# Patient Record
Sex: Female | Born: 1940 | Race: White | Hispanic: No | Marital: Single | Smoking: Former smoker
Health system: Southern US, Community
[De-identification: ages and names within clinical notes are randomized; demographics above are authoritative.]

## PROBLEM LIST (undated history)

## (undated) DIAGNOSIS — M1990 Unspecified osteoarthritis, unspecified site: Secondary | ICD-10-CM

## (undated) DIAGNOSIS — I10 Essential (primary) hypertension: Secondary | ICD-10-CM

## (undated) HISTORY — PX: CATARACT EXTRACTION: SUR2

---

## 2011-01-30 ENCOUNTER — Ambulatory Visit (INDEPENDENT_AMBULATORY_CARE_PROVIDER_SITE_OTHER): Payer: Medicare Other

## 2011-01-30 ENCOUNTER — Inpatient Hospital Stay (INDEPENDENT_AMBULATORY_CARE_PROVIDER_SITE_OTHER)
Admission: RE | Admit: 2011-01-30 | Discharge: 2011-01-30 | Disposition: A | Discharge status: Home / Self Care | Payer: Medicare Other | Source: Ambulatory Visit | Attending: Emergency Medicine | Admitting: Emergency Medicine

## 2011-01-30 DIAGNOSIS — IMO0002 Reserved for concepts with insufficient information to code with codable children: Secondary | ICD-10-CM

## 2013-12-09 ENCOUNTER — Encounter (HOSPITAL_COMMUNITY): Payer: Medicare Other | Admitting: Anesthesiology

## 2013-12-09 ENCOUNTER — Emergency Department (HOSPITAL_COMMUNITY): Payer: Medicare Other

## 2013-12-09 ENCOUNTER — Observation Stay (HOSPITAL_COMMUNITY)
Admission: EM | Admit: 2013-12-09 | Discharge: 2013-12-10 | Disposition: A | Discharge status: Home / Self Care | Payer: Medicare Other | Attending: Orthopaedic Surgery | Admitting: Orthopaedic Surgery

## 2013-12-09 ENCOUNTER — Encounter (HOSPITAL_COMMUNITY)
Admission: EM | Disposition: A | Discharge status: Home / Self Care | Payer: Self-pay | Source: Home / Self Care | Attending: Emergency Medicine

## 2013-12-09 ENCOUNTER — Encounter (HOSPITAL_COMMUNITY): Payer: Self-pay | Admitting: Emergency Medicine

## 2013-12-09 ENCOUNTER — Emergency Department (HOSPITAL_COMMUNITY): Payer: Medicare Other | Admitting: Anesthesiology

## 2013-12-09 DIAGNOSIS — W19XXXA Unspecified fall, initial encounter: Secondary | ICD-10-CM

## 2013-12-09 DIAGNOSIS — K449 Diaphragmatic hernia without obstruction or gangrene: Secondary | ICD-10-CM | POA: Insufficient documentation

## 2013-12-09 DIAGNOSIS — S52609A Unspecified fracture of lower end of unspecified ulna, initial encounter for closed fracture: Principal | ICD-10-CM

## 2013-12-09 DIAGNOSIS — Y92009 Unspecified place in unspecified non-institutional (private) residence as the place of occurrence of the external cause: Secondary | ICD-10-CM | POA: Insufficient documentation

## 2013-12-09 DIAGNOSIS — S5291XA Unspecified fracture of right forearm, initial encounter for closed fracture: Secondary | ICD-10-CM

## 2013-12-09 DIAGNOSIS — I10 Essential (primary) hypertension: Secondary | ICD-10-CM | POA: Insufficient documentation

## 2013-12-09 DIAGNOSIS — Z01811 Encounter for preprocedural respiratory examination: Secondary | ICD-10-CM

## 2013-12-09 DIAGNOSIS — K219 Gastro-esophageal reflux disease without esophagitis: Secondary | ICD-10-CM | POA: Insufficient documentation

## 2013-12-09 DIAGNOSIS — Z87891 Personal history of nicotine dependence: Secondary | ICD-10-CM | POA: Insufficient documentation

## 2013-12-09 DIAGNOSIS — W010XXA Fall on same level from slipping, tripping and stumbling without subsequent striking against object, initial encounter: Secondary | ICD-10-CM | POA: Insufficient documentation

## 2013-12-09 DIAGNOSIS — S52501A Unspecified fracture of the lower end of right radius, initial encounter for closed fracture: Secondary | ICD-10-CM | POA: Diagnosis present

## 2013-12-09 DIAGNOSIS — S52509A Unspecified fracture of the lower end of unspecified radius, initial encounter for closed fracture: Principal | ICD-10-CM | POA: Insufficient documentation

## 2013-12-09 HISTORY — DX: Unspecified osteoarthritis, unspecified site: M19.90

## 2013-12-09 HISTORY — DX: Essential (primary) hypertension: I10

## 2013-12-09 HISTORY — PX: OPEN REDUCTION INTERNAL FIXATION (ORIF) DISTAL RADIAL FRACTURE: SHX5989

## 2013-12-09 LAB — BASIC METABOLIC PANEL
BUN: 14 mg/dL (ref 6–23)
CHLORIDE: 101 meq/L (ref 96–112)
CO2: 22 meq/L (ref 19–32)
CREATININE: 0.73 mg/dL (ref 0.50–1.10)
Calcium: 9.2 mg/dL (ref 8.4–10.5)
GFR calc Af Amer: >90 mL/min (ref >90)
GFR calc non Af Amer: 83 mL/min — ABNORMAL LOW (ref >90)
Glucose, Bld: 115 mg/dL — ABNORMAL HIGH (ref 70–99)
Potassium: 3.8 mEq/L (ref 3.7–5.3)
Sodium: 140 mEq/L (ref 137–147)

## 2013-12-09 LAB — CBC
HEMATOCRIT: 39.1 % (ref 36.0–46.0)
Hemoglobin: 14 g/dL (ref 12.0–15.0)
MCH: 32 pg (ref 26.0–34.0)
MCHC: 35.8 g/dL (ref 30.0–36.0)
MCV: 89.3 fL (ref 78.0–100.0)
Platelets: 291 10*3/uL (ref 150–400)
RBC: 4.38 MIL/uL (ref 3.87–5.11)
RDW: 13 % (ref 11.5–15.5)
WBC: 12.3 10*3/uL — AB (ref 4.0–10.5)

## 2013-12-09 SURGERY — OPEN REDUCTION INTERNAL FIXATION (ORIF) DISTAL RADIUS FRACTURE
Anesthesia: General | Site: Wrist | Laterality: Right

## 2013-12-09 MED ORDER — VITAMIN D3 25 MCG (1000 UNIT) PO TABS
2000.0000 [IU] | ORAL_TABLET | Freq: Every day | ORAL | Status: DC
  Administered 2013-12-10: 2000 [IU] via ORAL
  Filled 2013-12-09 (×2): qty 2

## 2013-12-09 MED ORDER — TRIAMTERENE-HCTZ 37.5-25 MG PO TABS
1.0000 | ORAL_TABLET | Freq: Every day | ORAL | Status: DC
  Filled 2013-12-09: qty 1

## 2013-12-09 MED ORDER — HYDROCODONE-ACETAMINOPHEN 5-325 MG PO TABS
1.0000 | ORAL_TABLET | ORAL | Status: DC | PRN
  Administered 2013-12-09 – 2013-12-10 (×3): 1 via ORAL
  Filled 2013-12-09 (×3): qty 1

## 2013-12-09 MED ORDER — METOCLOPRAMIDE HCL 10 MG PO TABS: 5.0000 mg | ORAL_TABLET | Freq: Three times a day (TID) | ORAL | Status: DC | PRN

## 2013-12-09 MED ORDER — SODIUM CHLORIDE 0.9 % IV SOLN
INTRAVENOUS | Status: DC
  Administered 2013-12-09: 21:00:00 via INTRAVENOUS

## 2013-12-09 MED ORDER — BUPIVACAINE HCL (PF) 0.25 % IJ SOLN
INTRAMUSCULAR | Status: DC | PRN
  Administered 2013-12-09: 20 mL

## 2013-12-09 MED ORDER — SENNA 8.6 MG PO TABS
1.0000 | ORAL_TABLET | Freq: Two times a day (BID) | ORAL | Status: DC
  Administered 2013-12-09: 8.6 mg via ORAL
  Filled 2013-12-09 (×3): qty 1

## 2013-12-09 MED ORDER — LIDOCAINE HCL (CARDIAC) 20 MG/ML IV SOLN
INTRAVENOUS | Status: DC | PRN
  Administered 2013-12-09: 60 mg via INTRAVENOUS

## 2013-12-09 MED ORDER — FENTANYL CITRATE 0.05 MG/ML IJ SOLN: 25.0000 ug | INTRAMUSCULAR | Status: DC | PRN

## 2013-12-09 MED ORDER — LIDOCAINE HCL (CARDIAC) 20 MG/ML IV SOLN
INTRAVENOUS | Status: AC
  Filled 2013-12-09: qty 5

## 2013-12-09 MED ORDER — FENTANYL CITRATE 0.05 MG/ML IJ SOLN
INTRAMUSCULAR | Status: AC
  Filled 2013-12-09: qty 5

## 2013-12-09 MED ORDER — ONDANSETRON HCL 4 MG/2ML IJ SOLN: 4.0000 mg | Freq: Four times a day (QID) | INTRAMUSCULAR | Status: DC | PRN

## 2013-12-09 MED ORDER — METHOCARBAMOL 100 MG/ML IJ SOLN
500.0000 mg | Freq: Four times a day (QID) | INTRAVENOUS | Status: DC | PRN
  Filled 2013-12-09: qty 5

## 2013-12-09 MED ORDER — ONDANSETRON HCL 4 MG PO TABS: 4.0000 mg | ORAL_TABLET | Freq: Four times a day (QID) | ORAL | Status: DC | PRN

## 2013-12-09 MED ORDER — METHOCARBAMOL 500 MG PO TABS
500.0000 mg | ORAL_TABLET | Freq: Four times a day (QID) | ORAL | Status: DC | PRN
  Administered 2013-12-10: 500 mg via ORAL
  Filled 2013-12-09: qty 1

## 2013-12-09 MED ORDER — OXYCODONE HCL 5 MG PO TABS: 5.0000 mg | ORAL_TABLET | ORAL | Status: DC | PRN

## 2013-12-09 MED ORDER — SODIUM CHLORIDE 0.9 % IV SOLN: INTRAVENOUS | Status: DC

## 2013-12-09 MED ORDER — SORBITOL 70 % SOLN: 30.0000 mL | Freq: Every day | Status: DC | PRN

## 2013-12-09 MED ORDER — DEXTROSE 5 % IV SOLN
2.0000 g | INTRAVENOUS | Status: AC
  Administered 2013-12-09: 2 g via INTRAVENOUS
  Filled 2013-12-09: qty 20

## 2013-12-09 MED ORDER — PROPOFOL 10 MG/ML IV BOLUS
INTRAVENOUS | Status: AC
  Filled 2013-12-09: qty 20

## 2013-12-09 MED ORDER — FENTANYL CITRATE 0.05 MG/ML IJ SOLN
50.0000 ug | Freq: Once | INTRAMUSCULAR | Status: AC
  Administered 2013-12-09: 50 ug via INTRAVENOUS
  Filled 2013-12-09: qty 2

## 2013-12-09 MED ORDER — POLYETHYLENE GLYCOL 3350 17 G PO PACK: 17.0000 g | PACK | Freq: Every day | ORAL | Status: DC | PRN

## 2013-12-09 MED ORDER — CEFAZOLIN SODIUM-DEXTROSE 2-3 GM-% IV SOLR
INTRAVENOUS | Status: AC
  Filled 2013-12-09: qty 50

## 2013-12-09 MED ORDER — ROCURONIUM BROMIDE 50 MG/5ML IV SOLN
INTRAVENOUS | Status: AC
  Filled 2013-12-09: qty 1

## 2013-12-09 MED ORDER — MORPHINE SULFATE 2 MG/ML IJ SOLN: 1.0000 mg | INTRAMUSCULAR | Status: DC | PRN

## 2013-12-09 MED ORDER — CEFAZOLIN SODIUM-DEXTROSE 2-3 GM-% IV SOLR
2.0000 g | Freq: Four times a day (QID) | INTRAVENOUS | Status: AC
  Administered 2013-12-10 (×3): 2 g via INTRAVENOUS
  Filled 2013-12-09 (×3): qty 50

## 2013-12-09 MED ORDER — SUCCINYLCHOLINE CHLORIDE 20 MG/ML IJ SOLN
INTRAMUSCULAR | Status: AC
  Filled 2013-12-09: qty 1

## 2013-12-09 MED ORDER — EPHEDRINE SULFATE 50 MG/ML IJ SOLN
INTRAMUSCULAR | Status: AC
  Filled 2013-12-09: qty 1

## 2013-12-09 MED ORDER — PANTOPRAZOLE SODIUM 40 MG PO TBEC
40.0000 mg | DELAYED_RELEASE_TABLET | Freq: Every day | ORAL | Status: DC
  Administered 2013-12-10: 40 mg via ORAL
  Filled 2013-12-09: qty 1

## 2013-12-09 MED ORDER — LIDOCAINE HCL (PF) 1 % IJ SOLN
30.0000 mL | Freq: Once | INTRAMUSCULAR | Status: DC
  Filled 2013-12-09: qty 30

## 2013-12-09 MED ORDER — DIPHENHYDRAMINE HCL 12.5 MG/5ML PO ELIX: 25.0000 mg | ORAL_SOLUTION | ORAL | Status: DC | PRN

## 2013-12-09 MED ORDER — BUPIVACAINE HCL (PF) 0.25 % IJ SOLN
INTRAMUSCULAR | Status: AC
  Filled 2013-12-09: qty 30

## 2013-12-09 MED ORDER — SODIUM CHLORIDE 0.9 % IV BOLUS (SEPSIS)
500.0000 mL | Freq: Once | INTRAVENOUS | Status: AC
  Administered 2013-12-09: 500 mL via INTRAVENOUS

## 2013-12-09 MED ORDER — SUCCINYLCHOLINE CHLORIDE 20 MG/ML IJ SOLN
INTRAMUSCULAR | Status: DC | PRN
  Administered 2013-12-09: 100 mg via INTRAVENOUS

## 2013-12-09 MED ORDER — FENTANYL CITRATE 0.05 MG/ML IJ SOLN
INTRAMUSCULAR | Status: DC | PRN
  Administered 2013-12-09: 50 ug via INTRAVENOUS
  Administered 2013-12-09: 75 ug via INTRAVENOUS
  Administered 2013-12-09: 50 ug via INTRAVENOUS
  Administered 2013-12-09: 75 ug via INTRAVENOUS

## 2013-12-09 MED ORDER — TURMERIC CURCUMIN PO CAPS: 1.0000 | ORAL_CAPSULE | Freq: Every day | ORAL | Status: DC

## 2013-12-09 MED ORDER — PROPOFOL 10 MG/ML IV BOLUS
INTRAVENOUS | Status: DC | PRN
  Administered 2013-12-09: 140 mg via INTRAVENOUS

## 2013-12-09 MED ORDER — MAGNESIUM CITRATE PO SOLN: 1.0000 | Freq: Once | ORAL | Status: AC | PRN

## 2013-12-09 MED ORDER — METOCLOPRAMIDE HCL 5 MG/ML IJ SOLN: 5.0000 mg | Freq: Three times a day (TID) | INTRAMUSCULAR | Status: DC | PRN

## 2013-12-09 MED ORDER — CALCIPOTRIENE-BETAMETH DIPROP 0.005-0.064 % EX OINT: 1.0000 "application " | TOPICAL_OINTMENT | Freq: Every day | CUTANEOUS | Status: DC

## 2013-12-09 MED ORDER — EPHEDRINE SULFATE 50 MG/ML IJ SOLN
INTRAMUSCULAR | Status: DC | PRN
  Administered 2013-12-09: 10 mg via INTRAVENOUS
  Administered 2013-12-09: 15 mg via INTRAVENOUS
  Administered 2013-12-09: 10 mg via INTRAVENOUS

## 2013-12-09 MED ORDER — LACTATED RINGERS IV SOLN
INTRAVENOUS | Status: DC | PRN
  Administered 2013-12-09 (×2): via INTRAVENOUS

## 2013-12-09 SURGICAL SUPPLY — 64 items
1.25 PLATE REDUCTION WIRE, LARGE STOP ×3 IMPLANT
BANDAGE ELASTIC 3 VELCRO ST LF (GAUZE/BANDAGES/DRESSINGS) ×3 IMPLANT
BANDAGE ELASTIC 4 VELCRO ST LF (GAUZE/BANDAGES/DRESSINGS) ×3 IMPLANT
BANDAGE GAUZE ELAST BULKY 4 IN (GAUZE/BANDAGES/DRESSINGS) ×3 IMPLANT
BIT DRILL 100X2XQC STRL (BIT) ×1 IMPLANT
BIT DRILL CALIBRATED 1.8MM (BIT) ×1 IMPLANT
BIT DRILL QC 2.0X100 (BIT) ×2
BIT DRL 100X2XQC STRL (BIT) ×1
BLADE SURG ROTATE 9660 (MISCELLANEOUS) IMPLANT
BNDG ESMARK 4X9 LF (GAUZE/BANDAGES/DRESSINGS) ×3 IMPLANT
CLOTH BEACON ORANGE TIMEOUT ST (SAFETY) ×3 IMPLANT
CORDS BIPOLAR (ELECTRODE) ×3 IMPLANT
COVER SURGICAL LIGHT HANDLE (MISCELLANEOUS) ×3 IMPLANT
CUFF TOURNIQUET SINGLE 18IN (TOURNIQUET CUFF) ×3 IMPLANT
CUFF TOURNIQUET SINGLE 24IN (TOURNIQUET CUFF) IMPLANT
DECANTER SPIKE VIAL GLASS SM (MISCELLANEOUS) IMPLANT
DRAPE OEC MINIVIEW 54X84 (DRAPES) IMPLANT
DRAPE U-SHAPE 47X51 STRL (DRAPES) ×3 IMPLANT
DRILL CALIBRATED 1.8MM (BIT) ×3
ELECT CAUTERY BLADE 6.4 (BLADE) ×3 IMPLANT
FACESHIELD LNG OPTICON STERILE (SAFETY) IMPLANT
GAUZE XEROFORM 1X8 LF (GAUZE/BANDAGES/DRESSINGS) ×3 IMPLANT
GLOVE ORTHO TXT STRL SZ7.5 (GLOVE) ×6 IMPLANT
GLOVE SURG SS PI 7.5 STRL IVOR (GLOVE) ×3 IMPLANT
GOWN STRL NON-REIN LRG LVL3 (GOWN DISPOSABLE) ×9 IMPLANT
GOWN STRL REIN XL XLG (GOWN DISPOSABLE) ×6 IMPLANT
KIT BASIN OR (CUSTOM PROCEDURE TRAY) ×3 IMPLANT
KIT ROOM TURNOVER OR (KITS) ×3 IMPLANT
MANIFOLD NEPTUNE II (INSTRUMENTS) ×3 IMPLANT
NEEDLE 22X1 1/2 (OR ONLY) (NEEDLE) IMPLANT
NS IRRIG 1000ML POUR BTL (IV SOLUTION) ×3 IMPLANT
PACK ORTHO EXTREMITY (CUSTOM PROCEDURE TRAY) ×3 IMPLANT
PAD ARMBOARD 7.5X6 YLW CONV (MISCELLANEOUS) ×6 IMPLANT
PAD CAST 4YDX4 CTTN HI CHSV (CAST SUPPLIES) ×1 IMPLANT
PADDING CAST ABS 3INX4YD NS (CAST SUPPLIES) ×2
PADDING CAST ABS 4INX4YD NS (CAST SUPPLIES) ×2
PADDING CAST ABS COTTON 3X4 (CAST SUPPLIES) ×1 IMPLANT
PADDING CAST ABS COTTON 4X4 ST (CAST SUPPLIES) ×1 IMPLANT
PADDING CAST COTTON 4X4 STRL (CAST SUPPLIES) ×2
PLATE 2H NRW 2.4VA 6H TR (Plate) ×3 IMPLANT
SCREW LOCK T8 20X2.4XSTVA (Screw) ×2 IMPLANT
SCREW LOCKING 2.4X20 (Screw) ×4 IMPLANT
SCREW LOCKING 2.4X22 (Screw) ×6 IMPLANT
SCREW SELF TAP 12M (Screw) ×3 IMPLANT
SCREW SELF TAP 14MM (Screw) ×6 IMPLANT
SCREW VA LOCKING 2.4X16 (Screw) ×3 IMPLANT
SPONGE GAUZE 4X4 12PLY (GAUZE/BANDAGES/DRESSINGS) ×3 IMPLANT
SPONGE LAP 4X18 X RAY DECT (DISPOSABLE) IMPLANT
SUT ETHILON 3 0 PS 1 (SUTURE) ×3 IMPLANT
SUT ETHILON 4 0 PS 2 18 (SUTURE) ×3 IMPLANT
SUT MNCRL AB 4-0 PS2 18 (SUTURE) IMPLANT
SUT PROLENE 3 0 PS 2 (SUTURE) IMPLANT
SUT VIC AB 2-0 CT1 27 (SUTURE) ×2
SUT VIC AB 2-0 CT1 TAPERPNT 27 (SUTURE) ×1 IMPLANT
SUT VIC AB 3-0 FS2 27 (SUTURE) IMPLANT
SYR CONTROL 10ML LL (SYRINGE) IMPLANT
SYSTEM CHEST DRAIN TLS 7FR (DRAIN) ×3 IMPLANT
TOWEL OR 17X24 6PK STRL BLUE (TOWEL DISPOSABLE) ×3 IMPLANT
TOWEL OR 17X26 10 PK STRL BLUE (TOWEL DISPOSABLE) ×3 IMPLANT
TUBE CONNECTING 12'X1/4 (SUCTIONS) ×1
TUBE CONNECTING 12X1/4 (SUCTIONS) ×2 IMPLANT
UNDERPAD 30X30 INCONTINENT (UNDERPADS AND DIAPERS) ×3 IMPLANT
WATER STERILE IRR 1000ML POUR (IV SOLUTION) ×3 IMPLANT
WIRE PLATE REDUCTION 1.25 (Wire) ×3 IMPLANT

## 2013-12-09 NOTE — Preoperative (Addendum)
Beta Blockers   Reason not to administer Beta Blockers:Not Applicable 

## 2013-12-09 NOTE — ED Provider Notes (Signed)
CSN: [id number]     Arrival date & time 12/09/13  0865 History   First MD Initiated Contact with Patient 12/09/13 [phone]     Chief Complaint  Patient presents with  . Fall   (Consider location/radiation/quality/duration/timing/severity/associated sxs/prior Treatment) HPI Comments: 73 yo female with past smoking hx, no blood thinners presents with right wrist deformity since PTA when pt tripped and fell landing on right hand.  Pain with rom.  No other injuries.  No sxs prior to fall.  Fx years ago.    Patient is a 73 y.o. female presenting with fall. The history is provided by the patient.  Fall Pertinent negatives include no chest pain, no abdominal pain, no headaches and no shortness of breath.    Past Medical History  Diagnosis Date  . Hypertension   . Arthritis    Past Surgical History  Procedure Laterality Date  . Cataract extraction     History reviewed. No pertinent family history. History  Substance Use Topics  . Smoking status: Former Games developer  . Smokeless tobacco: Not on file  . Alcohol Use: Not on file   OB History   Grav Para Term Preterm Abortions TAB SAB Ect Mult Living                 Review of Systems  Constitutional: Negative for fever and chills.  HENT: Negative for congestion.   Eyes: Negative for visual disturbance.  Respiratory: Negative for shortness of breath.   Cardiovascular: Negative for chest pain.  Gastrointestinal: Negative for vomiting and abdominal pain.  Genitourinary: Negative for dysuria and flank pain.  Musculoskeletal: Negative for back pain, neck pain and neck stiffness.  Skin: Negative for rash.  Neurological: Negative for weakness, light-headedness, numbness and headaches.    Allergies  Review of patient's allergies indicates no known allergies.  Home Medications  No current outpatient prescriptions on file. BP 164/78  Pulse 91  Temp(Src) 97.9 F (36.6 C) (Oral)  Resp 14  SpO2 97% Physical Exam  Nursing note and vitals  reviewed. Constitutional: She is oriented to person, place, and time. She appears well-developed and well-nourished.  HENT:  Head: Normocephalic and atraumatic.  Eyes: Conjunctivae are normal. Right eye exhibits no discharge. Left eye exhibits no discharge.  Neck: Normal range of motion. Neck supple. No tracheal deviation present.  Cardiovascular: Normal rate and regular rhythm.   Pulmonary/Chest: Effort normal and breath sounds normal.  Musculoskeletal: She exhibits tenderness. She exhibits no edema.  Right distal radius/ ulna deformity with deviation toward ulna, nv intact distal, strong pulses, pain with palpation, closed, pt can move all fingers equal  Neurological: She is alert and oriented to person, place, and time.  Skin: Skin is warm. No rash noted.  Psychiatric: She has a normal mood and affect.    ED Course  Procedures (including critical care time) Labs Review Labs Reviewed  BASIC METABOLIC PANEL - Abnormal; Notable for the following:    Glucose, Bld 115 (*)    GFR calc non Af Amer 83 (*)    All other components within normal limits  CBC - Abnormal; Notable for the following:    WBC 12.3 (*)    All other components within normal limits   Imaging Review Dg Wrist 2 Views Right  12/09/2013   CLINICAL DATA:  ORIF right wrist  EXAM: RIGHT WRIST - 2 VIEW; DG C-ARM 1-60 MIN - NRPT [facility]  COMPARISON:  None.  FINDINGS: 36 seconds Two fluoro Skopic images are provided from internal  fixation of a right wrist fracture. There is has been interval placement of a volar distal radial plate with multiple interlocking screws transfixing a comminuted distal radial metaphysis fracture which is in near anatomic alignment. There is no obvious hardware failure or complication. One of the screws is close to the articular surface, but bone detail is limited on a fluoroscopic image. Attention on follow-up radiographs is recommended.  There is a comminuted fracture of the distal ulna involving the ulnar  metaphysis and styloid process which is in near anatomic alignment. There is ulnar minus variance.  IMPRESSION: ORIF right distal radius fracture.  Mildly comminuted distal ulnar fracture in near anatomic alignment.   Electronically Signed   By: Elige KoHetal  Patel   On: 12/09/2013 18:19   Dg Wrist Complete Right  12/09/2013   CLINICAL DATA:  Larey SeatFell and injured right wrist.  Obvious deformity.  EXAM: RIGHT WRIST - COMPLETE 3+ VIEW  COMPARISON:  None.  FINDINGS: Comminuted fracture involving the distal radius into at least 4 parts, involving the articular surface, with dorsal angulation and displacement of the distal fragments to overlie the distal radial metaphysis. The distal radial fracture fragments do appear anatomically aligned with the carpal row, though the fragment of the medial articular surface of the distal radius is displaced approximately 1 cm from the main distal fragment. Ulnar styloid difficult to visualize due to the overriding of the fragments, though I do not visualize a fracture. Complete loss of the joint space between the trapezium and the first metacarpal joint. Mild osseous demineralization.  IMPRESSION: Severely comminuted fracture involving the distal radius with dorsal angulation and overriding. The fracture is in at least 4 parts, and the articular surface of the distal radius is in 2 separate fragments which are displaced approximately 1 cm from 1 another. The carpal bones do appear anatomically aligned with the distal articular surface of the radius.   Electronically Signed   By: Hulan Saashomas  Lawrence M.D.   On: 12/09/2013 10:04   Dg Chest Port 1 View  12/09/2013   CLINICAL DATA:  Preop right wrist fracture  EXAM: PORTABLE CHEST - 1 VIEW  COMPARISON:  None.  FINDINGS: The heart size and mediastinal contours are within normal limits. Both lungs are clear. The visualized skeletal structures are unremarkable.  IMPRESSION: No active disease.   Electronically Signed   By: Elige KoHetal  Patel   On: 12/09/2013  15:45   Dg C-arm 1-60 Min-no Report  12/09/2013   CLINICAL DATA:  ORIF right wrist  EXAM: RIGHT WRIST - 2 VIEW; DG C-ARM 1-60 MIN - NRPT [facility]  COMPARISON:  None.  FINDINGS: 36 seconds Two fluoro Skopic images are provided from internal fixation of a right wrist fracture. There is has been interval placement of a volar distal radial plate with multiple interlocking screws transfixing a comminuted distal radial metaphysis fracture which is in near anatomic alignment. There is no obvious hardware failure or complication. One of the screws is close to the articular surface, but bone detail is limited on a fluoroscopic image. Attention on follow-up radiographs is recommended.  There is a comminuted fracture of the distal ulna involving the ulnar metaphysis and styloid process which is in near anatomic alignment. There is ulnar minus variance.  IMPRESSION: ORIF right distal radius fracture.  Mildly comminuted distal ulnar fracture in near anatomic alignment.   Electronically Signed   By: Elige KoHetal  Patel   On: 12/09/2013 18:19      MDM   1. Pre-op chest exam  Comminuted right distal radial fracture  Obvious fracture, possible dislocation, closed. IV, fluids, npo, xrays, pain meds.  Xray reviewed, comminuted, complex distal radius fracture. Ortho consulted, attempted local reduction in ED, unsuccessful, plan for OR/ admission.       Enid Skeens, MD 12/11/13 [phone]

## 2013-12-09 NOTE — ED Notes (Signed)
Dr Zavitz at bedside  

## 2013-12-09 NOTE — Progress Notes (Signed)
Orthopedic Tech Progress Note Patient Details:  Robin Neal 1941/04/09 [id number] Ortho Tech called for assistance with wrist reduction and to apply sugartong splint after reduction. Assisted Dr. Roda ShuttersXu with trying to reduce wrist. Several unsuccessful attempt made. Dr. Roda ShuttersXu asked that finger traps be applied for extra resistance and again attempted to reduce wrist. Unable to reduce at this time. Dr. Roda ShuttersXu talked with patient and family and is going to go ahead and do surgery today. Patient to remain in finger traps at this time per Dr. Warren DanesXu's request with nurse checking on patient and finger trap periodically. Nurse to call Ortho Tech back should she need assistance with device.  Patient ID: Robin Neal, female   DOB: 1941/04/09, 73 y.o.   MRN: [id number]   Robin Neal 12/09/2013, 11:03 AM

## 2013-12-09 NOTE — Anesthesia Preprocedure Evaluation (Addendum)
Anesthesia Evaluation  Patient identified by MRN, date of birth, ID band Patient awake    Reviewed: Allergy & Precautions, H&P , NPO status , Patient's Chart, lab work & pertinent test results  History of Anesthesia Complications Negative for: history of anesthetic complications  Airway Mallampati: II  Neck ROM: Full    Dental  (+) Teeth Intact and Dental Advisory Given   Pulmonary former smoker,          Cardiovascular hypertension, Pt. on medications     Neuro/Psych negative neurological ROS     GI/Hepatic Neg liver ROS, hiatal hernia, GERD-  Medicated and Controlled,  Endo/Other  negative endocrine ROS  Renal/GU negative Renal ROS     Musculoskeletal negative musculoskeletal ROS (+)   Abdominal   Peds  Hematology negative hematology ROS (+)   Anesthesia Other Findings   Reproductive/Obstetrics                      Anesthesia Physical Anesthesia Plan  ASA: III and emergent  Anesthesia Plan: General   Post-op Pain Management:    Induction: Intravenous  Airway Management Planned: Oral ETT  Additional Equipment:   Intra-op Plan:   Post-operative Plan: Extubation in OR  Informed Consent: I have reviewed the patients History and Physical, chart, labs and discussed the procedure including the risks, benefits and alternatives for the proposed anesthesia with the patient or authorized representative who has indicated his/her understanding and acceptance.   Dental advisory given  Plan Discussed with: CRNA and Anesthesiologist  Anesthesia Plan Comments:        Anesthesia Quick Evaluation

## 2013-12-09 NOTE — Transfer of Care (Signed)
Immediate Anesthesia Transfer of Care Note  Patient: Robin SlipperJanice Neal  Procedure(s) Performed: Procedure(s): OPEN REDUCTION INTERNAL FIXATION (ORIF) DISTAL RADIAL FRACTURE (Right)  Patient Location: PACU  Anesthesia Type:General  Level of Consciousness: awake  Airway & Oxygen Therapy: Patient Spontanous Breathing  Post-op Assessment: Report given to PACU RN and Post -op Vital signs reviewed and stable  Post vital signs: Reviewed and stable  Complications: No apparent anesthesia complications

## 2013-12-09 NOTE — Progress Notes (Signed)
Orthopedic Tech Progress Note Patient Details:  Robin Neal [birth year] [id number]  Patient ID: Robin Neal, female   DOB: [birth year], 73 y.o.   MRN: [id number] rn stated that pt has had surgery and does not need trapeze bar patient helper  Robin Neal, Robin Neal 12/09/2013, 1:31 PM

## 2013-12-09 NOTE — ED Notes (Signed)
Patient presents to ED after falling this am. Patient was walking in her garden and tripped and fell landing in her right hand. Deformity to right wrist. Cap refill <3 seconds. Patient able to move all fingers and thumb. No color discoloration.

## 2013-12-09 NOTE — Anesthesia Postprocedure Evaluation (Signed)
  Anesthesia Post-op Note  Patient: Robin SlipperJanice Neal  Procedure(s) Performed: Procedure(s): OPEN REDUCTION INTERNAL FIXATION (ORIF) DISTAL RADIAL FRACTURE (Right)  Patient Location: PACU  Anesthesia Type:General  Level of Consciousness: awake  Airway and Oxygen Therapy: Patient Spontanous Breathing  Post-op Pain: mild  Post-op Assessment: Post-op Vital signs reviewed  Post-op Vital Signs: Reviewed  Complications: No apparent anesthesia complications

## 2013-12-09 NOTE — Anesthesia Procedure Notes (Signed)
Procedure Name: Intubation Date/Time: 12/09/2013 4:59 PM Performed by: Alanda AmassFRIEDMAN, Jolie Strohecker A Pre-anesthesia Checklist: Patient identified, Timeout performed, Emergency Drugs available, Suction available and Patient being monitored Patient Re-evaluated:Patient Re-evaluated prior to inductionOxygen Delivery Method: Circle system utilized Preoxygenation: Pre-oxygenation with 100% oxygen Intubation Type: IV induction, Rapid sequence and Cricoid Pressure applied Grade View: Grade II Tube type: Oral Tube size: 7.5 mm Number of attempts: 1 Airway Equipment and Method: Stylet Secured at: 22 cm Tube secured with: Tape Dental Injury: Teeth and Oropharynx as per pre-operative assessment

## 2013-12-09 NOTE — Op Note (Signed)
   DATE OF SURGERY: 12/09/2013  PREOPERATIVE DIAGNOSIS: right distal radius fracture  POSTOPERATIVE DIAGNOSIS: same  PROCEDURE: Open reduction and internal fixation, Right distal radius.  CPT [id number] 3 or more fragments  IMPLANT: Synthes   SURGEON: Surgeon(s) and Role:    * Daimen Shovlin Glee ArvinMichael Caryssa Elzey, MD - Primary  ANESTHESIA: General  TOURNIQUET TIME:   BLOOD LOSS: Minimal.  COMPLICATIONS: None.  PATHOLOGY: None.  TIME OUT: Performed before the start of procedure.  INDICATIONS: The patient is a 73 y.o. female who presented with a displaced distal radius fracture that could not be closed reduced in the ER and indicated for osteosynthesis.  DESCRIPTION OF PROCEDURE:  The patient was placed in the supine position. Prophylactic antibiotics were administered prior to incision.  A well padded tourniquet was inflated.  The extremity was prepped and draped in standard sterile fashion. The extremity was exsanguinated, tourniquet inflated to 250 mmHg.  A FCR approach was used.  Dissection through the sheath of FCR was carried out and the FCR tendon was retracted. The floor of FCR sheath was released. Flexor tendons and median nerve were retracted ulnarly and protected. Pronator quadratus was released from distal radius. Fracture lines were visualized. Fracture was reduced under fluoroscopy. A volar locking plate was applied to the volar surface of the distal radius. 5 distal locking screws and 2 shaft  screws were inserted. Fluoroscopy was used to show adequate reduction. The tourniquet was deflated. Hemostasis was achieved. The wound was irrigated. Incision closed in layers. Sterile dressing applied. Wrist immobilized in a removable brace. The patient was transferred to the recovery room in stable condition after all counts were correct.  POSTOPERATIVE PLAN: The patient will remain in the brace until instructed. Sutures will be removed in two weeks. About four weeks after surgery, will start wrist range  of motion exercises, but in the meantime before then she will start aggressive finger range of motion exercises without resistance.  The "six pack of hand exercises" handout was given to the patient.  Mayra ReelN. Michael Jerni Selmer, MD [facility] [phone] 6:11 PM

## 2013-12-10 MED ORDER — HYDROCODONE-ACETAMINOPHEN 5-325 MG PO TABS: 1.0000 | ORAL_TABLET | Freq: Four times a day (QID) | ORAL | Status: AC | PRN

## 2013-12-10 MED ORDER — VITAMIN C 500 MG PO CHEW
500.0000 mg | CHEWABLE_TABLET | Freq: Two times a day (BID) | ORAL | Status: AC
Start: 2013-12-10 — End: ?

## 2013-12-10 NOTE — Progress Notes (Signed)
Patient provided with discharge instructions and follow up information. She was educated on importance of being NWB to the RUE and signs/symptoms of infection. She is going home with family support at this time.

## 2013-12-10 NOTE — Discharge Summary (Signed)
Physician Discharge Summary      Patient ID: Robin Neal MRN: [id number] DOB/AGE: 04-26-1941 73 y.o.  Admit date: 12/09/2013 Discharge date: 12/10/2013  Admission Diagnoses:  Right distal radius fracture  Discharge Diagnoses:  Active Problems:   Distal radius fracture, right   Past Medical History  Diagnosis Date  . Hypertension   . Arthritis     Surgeries: Procedure(s): OPEN REDUCTION INTERNAL FIXATION (ORIF) DISTAL RADIAL FRACTURE on 12/09/2013   Consultants (if any):    Discharged Condition: Improved  Hospital Course: Robin SlipperJanice Valadez is an 73 y.o. female who was admitted 12/09/2013 with a diagnosis of <principal problem not specified> and went to the operating room on 12/09/2013 and underwent the above named procedures.    She was given perioperative antibiotics:      Anti-infectives   Start     Dose/Rate Route Frequency Ordered Stop   12/09/13 2300  ceFAZolin (ANCEF) IVPB 2 g/50 mL premix     2 g 100 mL/hr over 30 Minutes Intravenous Every 6 hours 12/09/13 1918 12/10/13 1659   12/09/13 1636  ceFAZolin (ANCEF) 2-3 GM-% IVPB SOLR  Status:  Discontinued    Comments:  Mcphail, Nancy   : cabinet override      12/09/13 1636 12/09/13 1728   12/09/13 1600  ceFAZolin (ANCEF) 2 g in dextrose 5 % 50 mL IVPB     2 g 140 mL/hr over 30 Minutes Intravenous On call to O.R. 12/09/13 1505 12/09/13 1705    .  She was given sequential compression devices, early ambulation for DVT prophylaxis.  She benefited maximally from the hospital stay and there were no complications.    Recent vital signs:  Filed Vitals:   12/10/13 0453  BP: 138/68  Pulse: 74  Temp: 98 F (36.7 C)  Resp: 18    Recent laboratory studies:  Lab Results  Component Value Date   HGB 14.0 12/09/2013   Lab Results  Component Value Date   WBC 12.3* 12/09/2013   PLT 291 12/09/2013   No results found for this basename: INR   Lab Results  Component Value Date   NA 140 12/09/2013   K 3.8 12/09/2013   CL 101  12/09/2013   CO2 22 12/09/2013   BUN 14 12/09/2013   CREATININE 0.73 12/09/2013   GLUCOSE 115* 12/09/2013    Discharge Medications:     Medication List         ALEVE 220 MG tablet  Generic drug:  naproxen sodium  Take 440 mg by mouth daily as needed (arthritis pain).     calcipotriene-betamethasone ointment  Commonly known as:  TACLONEX  Apply 1 application topically daily.     cholecalciferol 1000 UNITS tablet  Commonly known as:  VITAMIN D  Take 2,000 Units by mouth daily.     HYDROcodone-acetaminophen 5-325 MG per tablet  Commonly known as:  NORCO  Take 1-2 tablets by mouth every 6 (six) hours as needed.     omeprazole 20 MG capsule  Commonly known as:  PRILOSEC  Take 20 mg by mouth every other day.     ranitidine 150 MG tablet  Commonly known as:  ZANTAC  Take 150 mg by mouth every other day.     triamterene-hydrochlorothiazide 37.5-25 MG per tablet  Commonly known as:  MAXZIDE-25  Take 1 tablet by mouth daily.     Turmeric Curcumin Caps  Take 1 capsule by mouth daily.     Vitamin C 500 MG Chew  Chew 1 tablet (  500 mg total) by mouth 2 (two) times daily.        Diagnostic Studies: Dg Wrist 2 Views Right  01/05/14   CLINICAL DATA:  ORIF right wrist  EXAM: RIGHT WRIST - 2 VIEW; DG C-ARM 1-60 MIN - NRPT [facility]  COMPARISON:  None.  FINDINGS: 36 seconds Two fluoro Skopic images are provided from internal fixation of a right wrist fracture. There is has been interval placement of a volar distal radial plate with multiple interlocking screws transfixing a comminuted distal radial metaphysis fracture which is in near anatomic alignment. There is no obvious hardware failure or complication. One of the screws is close to the articular surface, but bone detail is limited on a fluoroscopic image. Attention on follow-up radiographs is recommended.  There is a comminuted fracture of the distal ulna involving the ulnar metaphysis and styloid process which is in near anatomic alignment.  There is ulnar minus variance.  IMPRESSION: ORIF right distal radius fracture.  Mildly comminuted distal ulnar fracture in near anatomic alignment.   Electronically Signed   By: Elige Ko   On: 2014-01-05 18:19   Dg Wrist Complete Right  01/05/2014   CLINICAL DATA:  Larey Seat and injured right wrist.  Obvious deformity.  EXAM: RIGHT WRIST - COMPLETE 3+ VIEW  COMPARISON:  None.  FINDINGS: Comminuted fracture involving the distal radius into at least 4 parts, involving the articular surface, with dorsal angulation and displacement of the distal fragments to overlie the distal radial metaphysis. The distal radial fracture fragments do appear anatomically aligned with the carpal row, though the fragment of the medial articular surface of the distal radius is displaced approximately 1 cm from the main distal fragment. Ulnar styloid difficult to visualize due to the overriding of the fragments, though I do not visualize a fracture. Complete loss of the joint space between the trapezium and the first metacarpal joint. Mild osseous demineralization.  IMPRESSION: Severely comminuted fracture involving the distal radius with dorsal angulation and overriding. The fracture is in at least 4 parts, and the articular surface of the distal radius is in 2 separate fragments which are displaced approximately 1 cm from 1 another. The carpal bones do appear anatomically aligned with the distal articular surface of the radius.   Electronically Signed   By: Hulan Saas M.D.   On: 01-05-14 10:04   Dg Chest Port 1 View  2014-01-05   CLINICAL DATA:  Preop right wrist fracture  EXAM: PORTABLE CHEST - 1 VIEW  COMPARISON:  None.  FINDINGS: The heart size and mediastinal contours are within normal limits. Both lungs are clear. The visualized skeletal structures are unremarkable.  IMPRESSION: No active disease.   Electronically Signed   By: Elige Ko   On: 2014/01/05 15:45   Dg C-arm 1-60 Min-no Report  01/05/2014   CLINICAL DATA:   ORIF right wrist  EXAM: RIGHT WRIST - 2 VIEW; DG C-ARM 1-60 MIN - NRPT [facility]  COMPARISON:  None.  FINDINGS: 36 seconds Two fluoro Skopic images are provided from internal fixation of a right wrist fracture. There is has been interval placement of a volar distal radial plate with multiple interlocking screws transfixing a comminuted distal radial metaphysis fracture which is in near anatomic alignment. There is no obvious hardware failure or complication. One of the screws is close to the articular surface, but bone detail is limited on a fluoroscopic image. Attention on follow-up radiographs is recommended.  There is a comminuted fracture of the distal ulna  involving the ulnar metaphysis and styloid process which is in near anatomic alignment. There is ulnar minus variance.  IMPRESSION: ORIF right distal radius fracture.  Mildly comminuted distal ulnar fracture in near anatomic alignment.   Electronically Signed   By: Elige Ko   On: 12/09/2013 18:19    Disposition: Final discharge disposition not confirmed  Discharge Orders   Future Orders Complete By Expires   Call MD / Call 911  As directed    Comments:     If you experience chest pain or shortness of breath, CALL 911 and be transported to the hospital emergency room.  If you develope a fever above 101.5 F, pus (white drainage) or increased drainage or redness at the wound, or calf pain, call your surgeon's office.   Constipation Prevention  As directed    Comments:     Drink plenty of fluids.  Prune juice may be helpful.  You may use a stool softener, such as Colace (over the counter) 100 mg twice a day.  Use MiraLax (over the counter) for constipation as needed.   Diet - low sodium heart healthy  As directed    Driving restrictions  As directed    Comments:     No driving while taking narcotic pain meds.   Increase activity slowly as tolerated  As directed    Non weight bearing  As directed    Questions:     Laterality:     Extremity:          Follow-up Information   Follow up with Cheral Almas, MD In 2 weeks.   Specialty:  Orthopedic Surgery   Contact information:   [address] [place] [address] [phone]        Signed: Cheral Almas 12/10/2013, 8:16 AM

## 2013-12-10 NOTE — Evaluation (Signed)
Occupational Therapy Evaluation and Discharge Patient Details Name: Robin Neal MRN: [id number] DOB: 1941/06/03 Today's Date: 12/10/2013 Time: 4098-1191 OT Time Calculation (min): 16 min  OT Assessment / Plan / Recommendation History of present illness Open reduction and internal fixation, Right distal radius   Clinical Impression   This 73 yo female admitted and underwent above presents to acute OT with all education completed. Will D/C from acute OT.    OT Assessment   (all futher needs per ortho MD)    Follow Up Recommendations   (Per ortho MD)       Equipment Recommendations  None recommended by OT          Precautions / Restrictions Precautions Precautions: Fall Restrictions Weight Bearing Restrictions: Yes RUE Weight Bearing: Weight bear through elbow only   Pertinent Vitals/Pain No c/o pain    ADL  Transfers/Ambulation Related to ADLs: Independent in room; pt did report that her left hip does hurt her some at times and she is going for an ortho consult soon. I recommended that she might consider a straight cane if she feels this would help her feel better about her balance on days that her hip hurts her. I demonstrated to her how to adjust the cane tofit her height if she decides to get one. ADL Comments: Explained to patient how to keep RUE dry when taking a shower, to use elastic waist pants for ease, to not PUSH, PULL, LIFT objects with her RUE (although holding light objects like a toothbrush or a small 1/2 glass of water is fine), to use wet wipes for per-hygiene since this will be eaiser than toliet paper with a non-dominant hand. She is to put her RUE in a shirt first and take out last to make dressing easier. Keep hand higher than elbow to help with edema control. Hand out provided     Acute Rehab OT Goals Patient Stated Goal: Home today  Visit Information  Last OT Received On: 12/10/13 Assistance Needed: +1 History of Present Illness: Open reduction and  internal fixation, Right distal radius       Prior Functioning     Home Living Family/patient expects to be discharged to:: Private residence Living Arrangements: Non-relatives/Friends Available Help at Discharge: Family Type of Home: House Home Access: Stairs to enter Secretary/administrator of Steps: 2 Entrance Stairs-Rails: Left Home Layout: One level Home Equipment: None Prior Function Level of Independence: Independent Communication Communication: No difficulties Dominant Hand: Right         Vision/Perception Vision - History Patient Visual Report: No change from baseline   Cognition  Cognition Arousal/Alertness: Awake/alert Behavior During Therapy: WFL for tasks assessed/performed Overall Cognitive Status: Within Functional Limits for tasks assessed    Extremity/Trunk Assessment Upper Extremity Assessment Upper Extremity Assessment: RUE deficits/detail RUE Deficits / Details: Pt s/p right distal radiius ORIF. Pt with limited ROM of digits due to wrapping, but can use enough functionally to eat. Pt without range of wrist at this time due to wrapping. Elbow and proximally is WNL RUE Coordination: decreased fine motor;decreased gross motor     Mobility Bed Mobility General bed mobility comments: Pt sitting on EOB eating her breakfast when I entered Transfers Overall transfer level: Independent     Exercise Other Exercises Other Exercises: Open/close her and; thumb to finger tips, spread fingers apart and then back together, keep large knuckles straight and bend the other two, use squeeze ball provided (had out provided)      End of Session  OT - End of Session Equipment Utilized During Treatment:  (None) Activity Tolerance: Patient tolerated treatment well Patient left:  (sitting EOB eating breakfast) Nurse Communication:  (To ortho tech as I was leaving unit: Needs a sling for RUE)  GO Functional Assessment Tool Used: Clinical observation Functional  Limitation: Self care Self Care Current Status (E4540(G8987): At least 1 percent but less than 20 percent impaired, limited or restricted Self Care Goal Status (J8119(G8988): At least 1 percent but less than 20 percent impaired, limited or restricted Self Care Discharge Status [phone]([id number]): At least 1 percent but less than 20 percent impaired, limited or restricted   Evette GeorgesLeonard, Elinora Weigand Eva [phone] 12/10/2013, 11:31 AM

## 2013-12-10 NOTE — Progress Notes (Signed)
Orthopedic Tech Progress Note Patient Details:  Enis SlipperJanice Allsbrook [birth year] [id number]  Ortho Devices Type of Ortho Device: Arm sling;Volar splint;Ace wrap Ortho Device/Splint Location: Replacement volar splint Ortho Device/Splint Interventions: Application   Cammer, Mickie BailJennifer Carol 12/10/2013, 9:21 AM

## 2013-12-10 NOTE — Care Management Note (Signed)
CARE MANAGEMENT NOTE 12/10/2013  Patient:  Robin Neal,Robin Neal   Account Number:  [id number]  Date Initiated:  12/10/2013  Documentation initiated by:  Vance PeperBRADY,Jerelyn Trimarco  Subjective/Objective Assessment:   73 yr old female s/pORIF of right distal radius fracture.     Action/Plan:   Case manager spoke with Patient , no home health needs identified.   Anticipated DC Date:  12/10/2013   Anticipated DC Plan:  HOME/SELF CARE      DC Planning Services  CM consult      PAC Choice  NA   Choice offered to / List presented to:             Status of service:  Completed, signed off  Discharge Disposition:  HOME/SELF CARE

## 2013-12-10 NOTE — Progress Notes (Signed)
   Subjective:  Patient reports pain as mild.    Objective:   VITALS:   Filed Vitals:   12/09/13 1908 12/09/13 2030 12/09/13 2200 12/10/13 0453  BP: 134/77 130/70 158/65 138/68  Pulse: 88 80 84 74  Temp: 98.1 F (36.7 C) 98 F (36.7 C) 98.1 F (36.7 C) 98 F (36.7 C)  TempSrc:    Oral  Resp: 16 16 16 18   Height: 5\' 9"  (1.753 m)     Weight: 76.204 kg (168 lb)     SpO2: 97% 98% 96% 96%    Neurologically intact Neurovascular intact Sensation intact distally Intact pulses distally Incision: dressing C/D/I and scant drainage Compartment soft   Lab Results  Component Value Date   WBC 12.3* 12/09/2013   HGB 14.0 12/09/2013   HCT 39.1 12/09/2013   MCV 89.3 12/09/2013   PLT 291 12/09/2013     Assessment/Plan: 1 Day Post-Op   Problem List Items Addressed This Visit   None    Visit Diagnoses   Pre-op chest exam    -  Primary    Relevant Orders       DG Chest Port 1 View (Completed)       Non weight bearing       Call MD / Call 911       Diet - low sodium heart healthy       Constipation Prevention       Increase activity slowly as tolerated       Driving restrictions       Expected postop acute blood loss anemia - will monitor for symptoms Up with OT TLS drain d/c'ed Splint change ordered DVT ppx - SCDs, ambulation NWB right and upper extremity Pain control Discharge planning - plan to d/c home today   Cheral AlmasXu, Naiping Michael 12/10/2013, 8:15 AM [phone]

## 2013-12-10 NOTE — Discharge Instructions (Signed)
General Anesthesia, Adult, Care After Refer to this sheet in the next few weeks. These instructions provide you with information on caring for yourself after your procedure. Your health care provider may also give you more specific instructions. Your treatment has been planned according to current medical practices, but problems sometimes occur. Call your health care provider if you have any problems or questions after your procedure. WHAT TO EXPECT AFTER THE PROCEDURE After the procedure, it is typical to experience:  Sleepiness.  Nausea and vomiting. HOME CARE INSTRUCTIONS  For the first 24 hours after general anesthesia:  Have a responsible person with you.  Do not drive a car. If you are alone, do not take public transportation.  Do not drink alcohol.  Do not take medicine that has not been prescribed by your health care provider.  Do not sign important papers or make important decisions.  You may resume a normal diet and activities as directed by your health care provider.  Change bandages (dressings) as directed.  If you have questions or problems that seem related to general anesthesia, call the hospital and ask for the anesthetist or anesthesiologist on call. SEEK MEDICAL CARE IF:  You have nausea and vomiting that continue the day after anesthesia.  You develop a rash. SEEK IMMEDIATE MEDICAL CARE IF:   You have difficulty breathing.  You have chest pain.  You have any allergic problems. Document Released: 01/31/2001 Document Revised: 06/27/2013 Document Reviewed: 05/10/2013 [facility] Patient Information 2014 [place], [place].  1. Keep splint clean and dry 2. Non weight bearing to right upper extremity

## 2013-12-11 NOTE — H&P (Signed)
H&P  Chief Complaint: right distal radius fracture  HPI: Robin Neal is a 73 y.o. female who presented to the [facility] ER s/p mechanical fall in garden onto outstretched hand.  Denies LOC, neck pain, abd pain.  Has obvious deformity to right wrist.  Denies any s/s of acute CTS.  Denies any previous trauma to wrist.    Past Medical History  Diagnosis Date  . Hypertension   . Arthritis    Past Surgical History  Procedure Laterality Date  . Cataract extraction     History   Social History  . Marital Status: Single    Spouse Name: N/A    Number of Children: N/A  . Years of Education: N/A   Social History Main Topics  . Smoking status: Former Games developermoker  . Smokeless tobacco: None  . Alcohol Use: None  . Drug Use: None  . Sexual Activity: None   Other Topics Concern  . None   Social History Narrative  . None   History reviewed. No pertinent family history. No Known Allergies Prior to Admission medications   Medication Sig Start Date End Date Taking? Authorizing Provider  calcipotriene-betamethasone (TACLONEX) ointment Apply 1 application topically daily.   Yes Historical Provider, MD  cholecalciferol (VITAMIN D) 1000 UNITS tablet Take 2,000 Units by mouth daily.   Yes Historical Provider, MD  Misc Natural Products (TURMERIC CURCUMIN) CAPS Take 1 capsule by mouth daily.   Yes Historical Provider, MD  naproxen sodium (ALEVE) 220 MG tablet Take 440 mg by mouth daily as needed (arthritis pain).   Yes Historical Provider, MD  omeprazole (PRILOSEC) 20 MG capsule Take 20 mg by mouth every other day.   Yes Historical Provider, MD  ranitidine (ZANTAC) 150 MG tablet Take 150 mg by mouth every other day.   Yes Historical Provider, MD  triamterene-hydrochlorothiazide (MAXZIDE-25) 37.5-25 MG per tablet Take 1 tablet by mouth daily.   Yes Historical Provider, MD  Ascorbic Acid (VITAMIN C) 500 MG CHEW Chew 1 tablet (500 mg total) by mouth 2 (two) times daily. 12/10/13   [name], MD   HYDROcodone-acetaminophen (NORCO) 5-325 MG per tablet Take 1-2 tablets by mouth every 6 (six) hours as needed. 12/10/13   [name], MD     Positive ROS: All other systems have been reviewed and were otherwise negative with the exception of those mentioned in the HPI and as above.  Physical Exam: General: Alert, no acute distress Cardiovascular: No pedal edema Respiratory: No cyanosis, no use of accessory musculature GI: No organomegaly, abdomen is soft and non-tender Skin: No lesions in the area of chief complaint Neurologic: Sensation intact distally Psychiatric: Patient is competent for consent with normal mood and affect Lymphatic: No axillary or cervical lymphadenopathy  MUSCULOSKELETAL:  RUE - obvious deformity to right wrist - skin intact - bounding pulses - no signs of acute CTS - NVI distally  Assessment: right distal radius fracture  Plan:   Failed to close reduce fracture in ER after multiple attempts Recommend acute ORIF Admit to ortho overnight afterwards  Plan for Procedure(s): OPEN REDUCTION INTERNAL FIXATION (ORIF) DISTAL RADIAL FRACTURE  The risks benefits and alternatives were discussed with the patient including but not limited to the risks of nonoperative treatment, versus surgical intervention including infection, bleeding, nerve injury,  blood clots, cardiopulmonary complications, morbidity, mortality, among others, and they were willing to proceed.   Cheral AlmasXu, [name], MD   12/11/2013 1:02 PM

## 2013-12-20 ENCOUNTER — Encounter (HOSPITAL_COMMUNITY): Payer: Self-pay | Admitting: Orthopaedic Surgery

## 2014-10-27 IMAGING — CR DG WRIST COMPLETE 3+V*R*
4 series · 4 of 4 positions shown · non-contrast
Comparison: None.

CLINICAL DATA: Fell and injured right wrist.  Obvious deformity.

EXAM:
RIGHT WRIST - COMPLETE 3+ VIEW

[x wrist pa right]
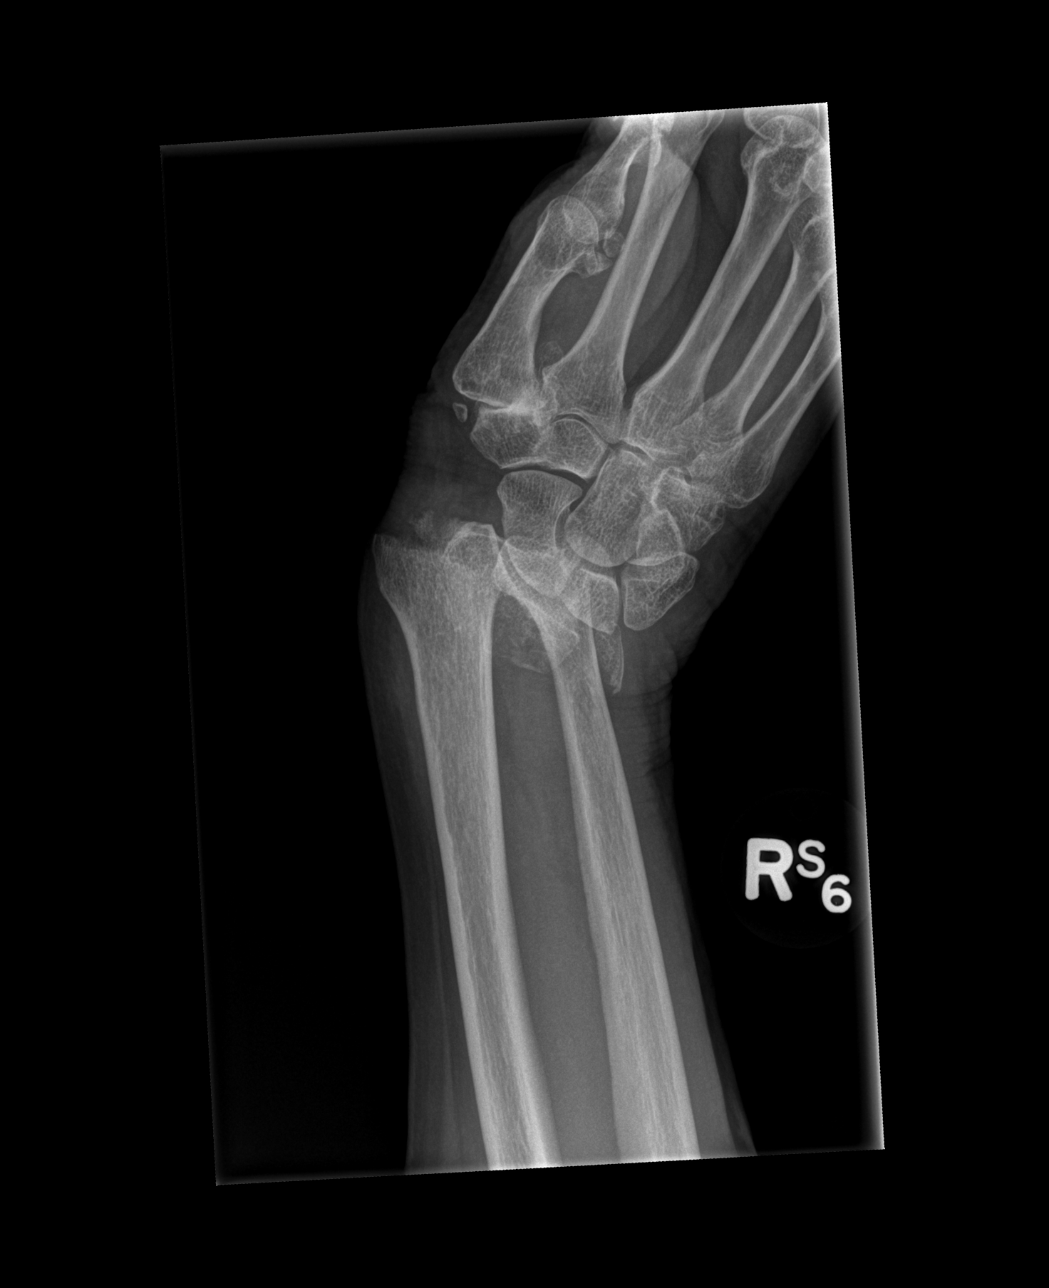

[x wrist obl right]
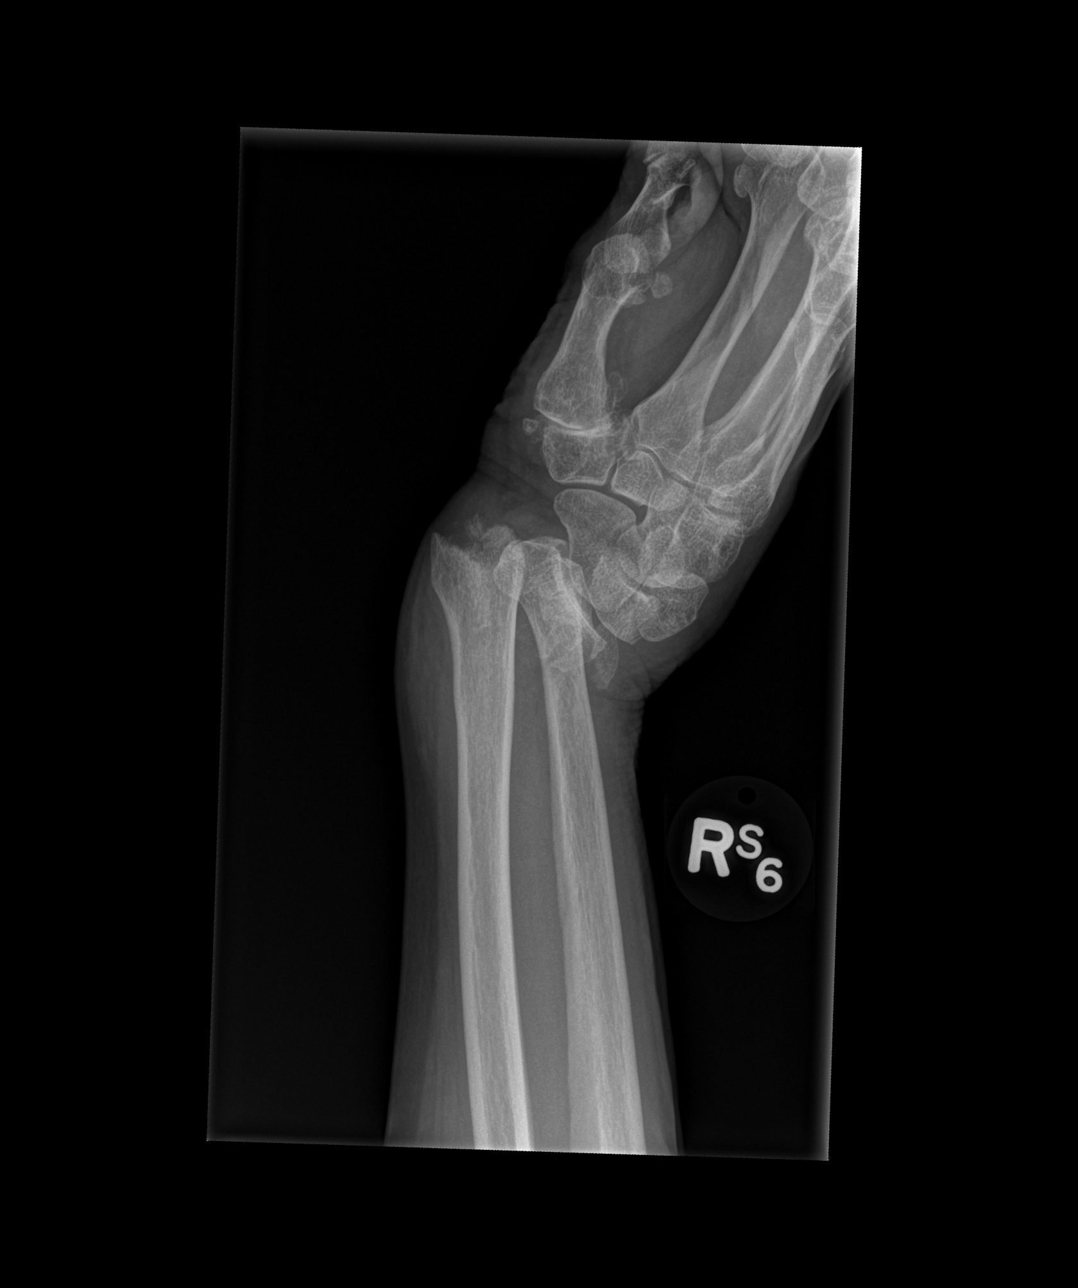

[x wrist lat right]
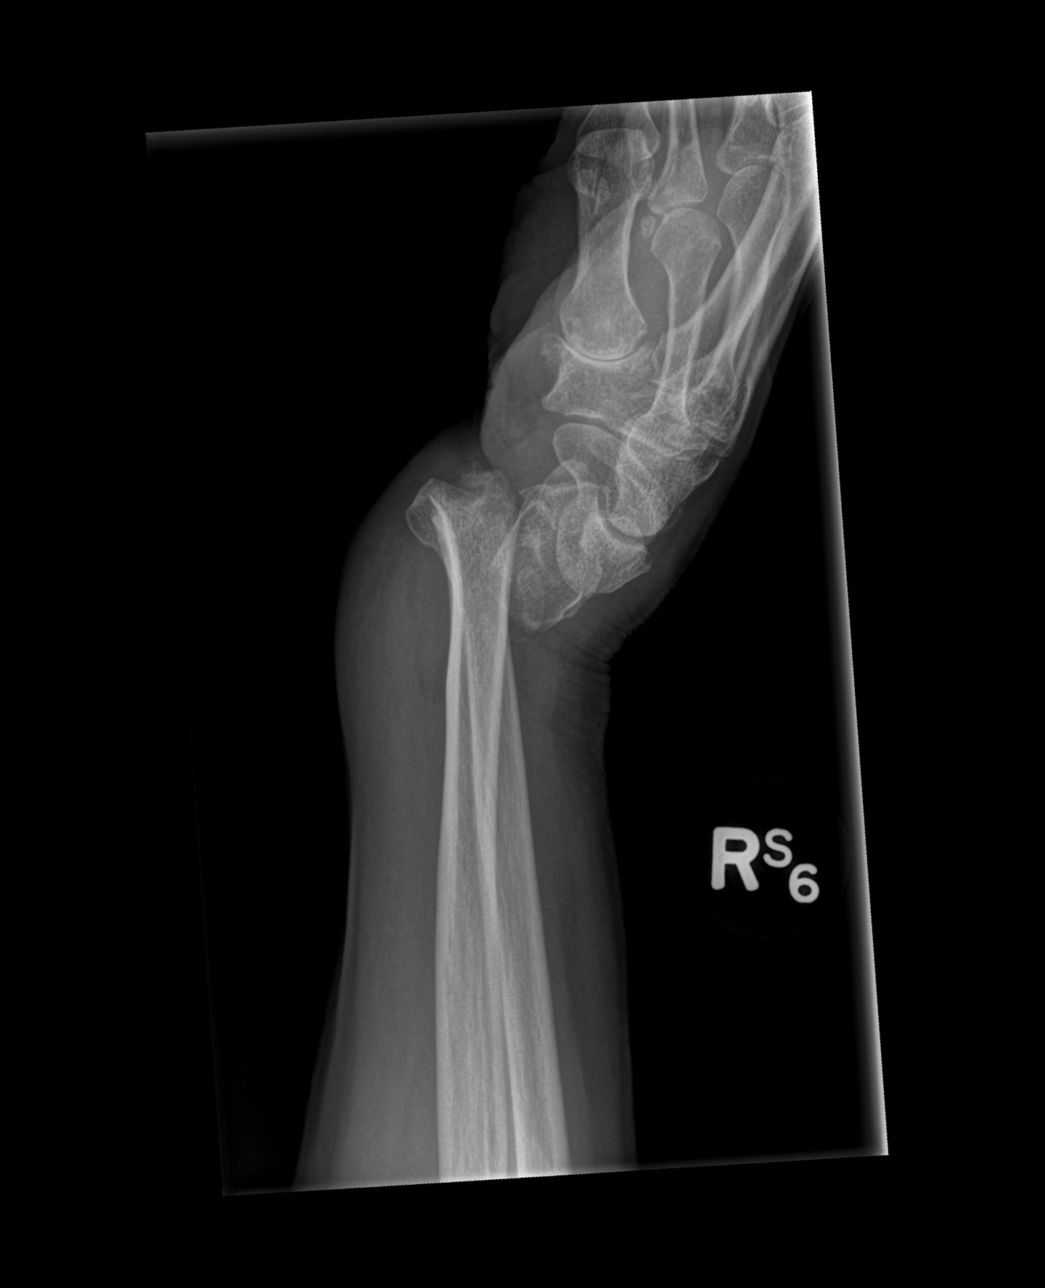

[x wrist navicular view right]
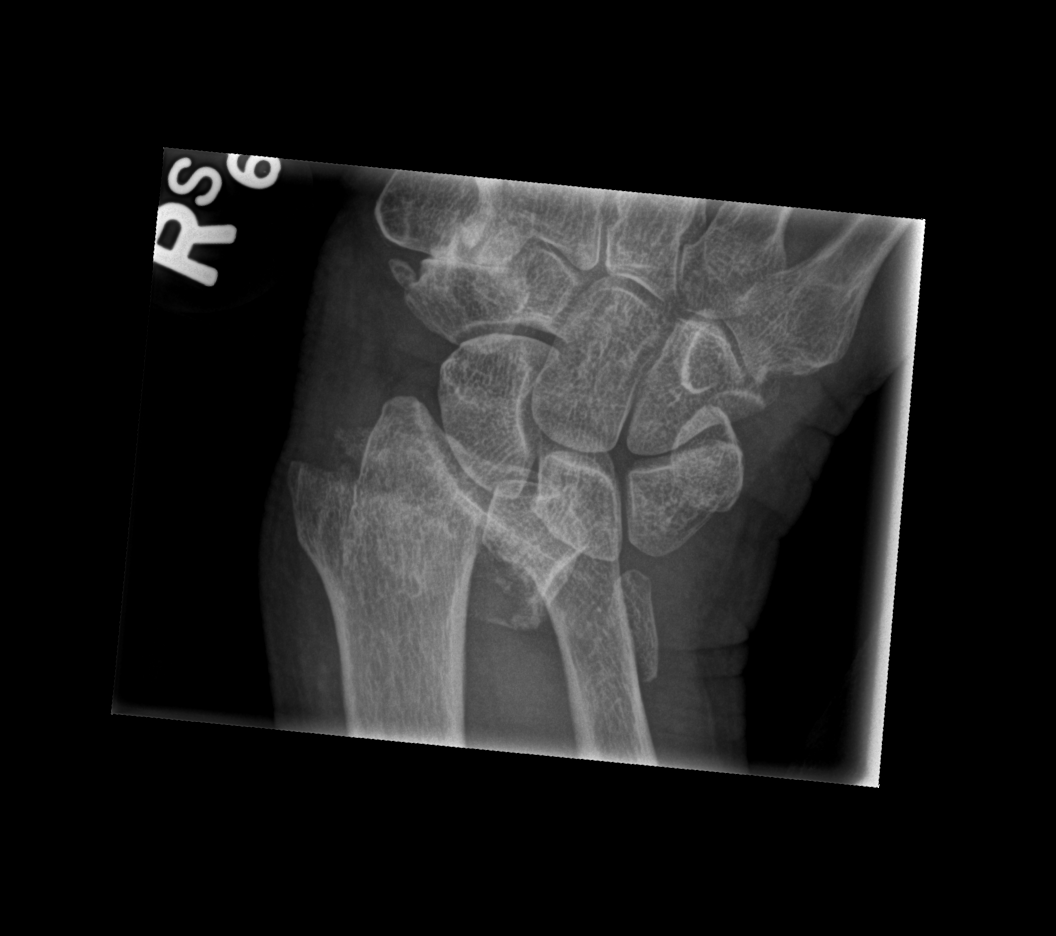

[4 of 4 positions shown; findings below may reference images not displayed]

FINDINGS: Comminuted fracture involving the distal radius into at least 4
parts, involving the articular surface, with dorsal angulation and
displacement of the distal fragments to overlie the distal radial
metaphysis. The distal radial fracture fragments do appear
anatomically aligned with the carpal row, though the fragment of the
medial articular surface of the distal radius is displaced
approximately 1 cm from the main distal fragment. Ulnar styloid
difficult to visualize due to the overriding of the fragments,
though I do not visualize a fracture. Complete loss of the joint
space between the trapezium and the first metacarpal joint. Mild
osseous demineralization.
IMPRESSION: Severely comminuted fracture involving the distal radius with dorsal
angulation and overriding. The fracture is in at least 4 parts, and
the articular surface of the distal radius is in 2 separate
fragments which are displaced approximately 1 cm from 1 another. The
carpal bones do appear anatomically aligned with the distal
articular surface of the radius.
